# Patient Record
Sex: Male | Born: 2000 | Race: White | Hispanic: No | Marital: Single
Health system: Southern US, Community
[De-identification: ages and names within clinical notes are randomized; demographics above are authoritative.]

## PROBLEM LIST (undated history)

## (undated) DIAGNOSIS — R278 Other lack of coordination: Secondary | ICD-10-CM

## (undated) DIAGNOSIS — F902 Attention-deficit hyperactivity disorder, combined type: Principal | ICD-10-CM

## (undated) DIAGNOSIS — G43909 Migraine, unspecified, not intractable, without status migrainosus: Secondary | ICD-10-CM

## (undated) HISTORY — DX: Attention-deficit hyperactivity disorder, combined type: F90.2

## (undated) HISTORY — DX: Other lack of coordination: R27.8

---
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
## 2022-02-04 ENCOUNTER — Emergency Department
Admission: EM | Admit: 2022-02-04 | Discharge: 2022-02-04 | Disposition: A | Discharge status: Home / Self Care | Payer: Medicaid Other | Attending: Emergency Medicine | Admitting: Emergency Medicine

## 2022-02-04 ENCOUNTER — Encounter: Payer: Self-pay | Admitting: Emergency Medicine

## 2022-02-04 ENCOUNTER — Other Ambulatory Visit: Payer: Self-pay

## 2022-02-04 DIAGNOSIS — S8001XA Contusion of right knee, initial encounter: Secondary | ICD-10-CM | POA: Insufficient documentation

## 2022-02-04 DIAGNOSIS — Y92410 Unspecified street and highway as the place of occurrence of the external cause: Secondary | ICD-10-CM | POA: Diagnosis not present

## 2022-02-04 DIAGNOSIS — S8000XA Contusion of unspecified knee, initial encounter: Secondary | ICD-10-CM

## 2022-02-04 DIAGNOSIS — S3992XA Unspecified injury of lower back, initial encounter: Secondary | ICD-10-CM | POA: Diagnosis present

## 2022-02-04 DIAGNOSIS — S39012A Strain of muscle, fascia and tendon of lower back, initial encounter: Secondary | ICD-10-CM | POA: Diagnosis not present
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
## 2022-02-04 NOTE — ED Notes (Signed)
Dr. Corky Downs seeing pt in triage at this time.

## 2022-02-20 IMAGING — CR DG CHEST 2V
2 series · 2 of 2 positions shown · non-contrast
Comparison: May 11, 2020

CLINICAL DATA: Cough with hemoptysis

EXAM:
CHEST - 2 VIEW

[w chest pa]
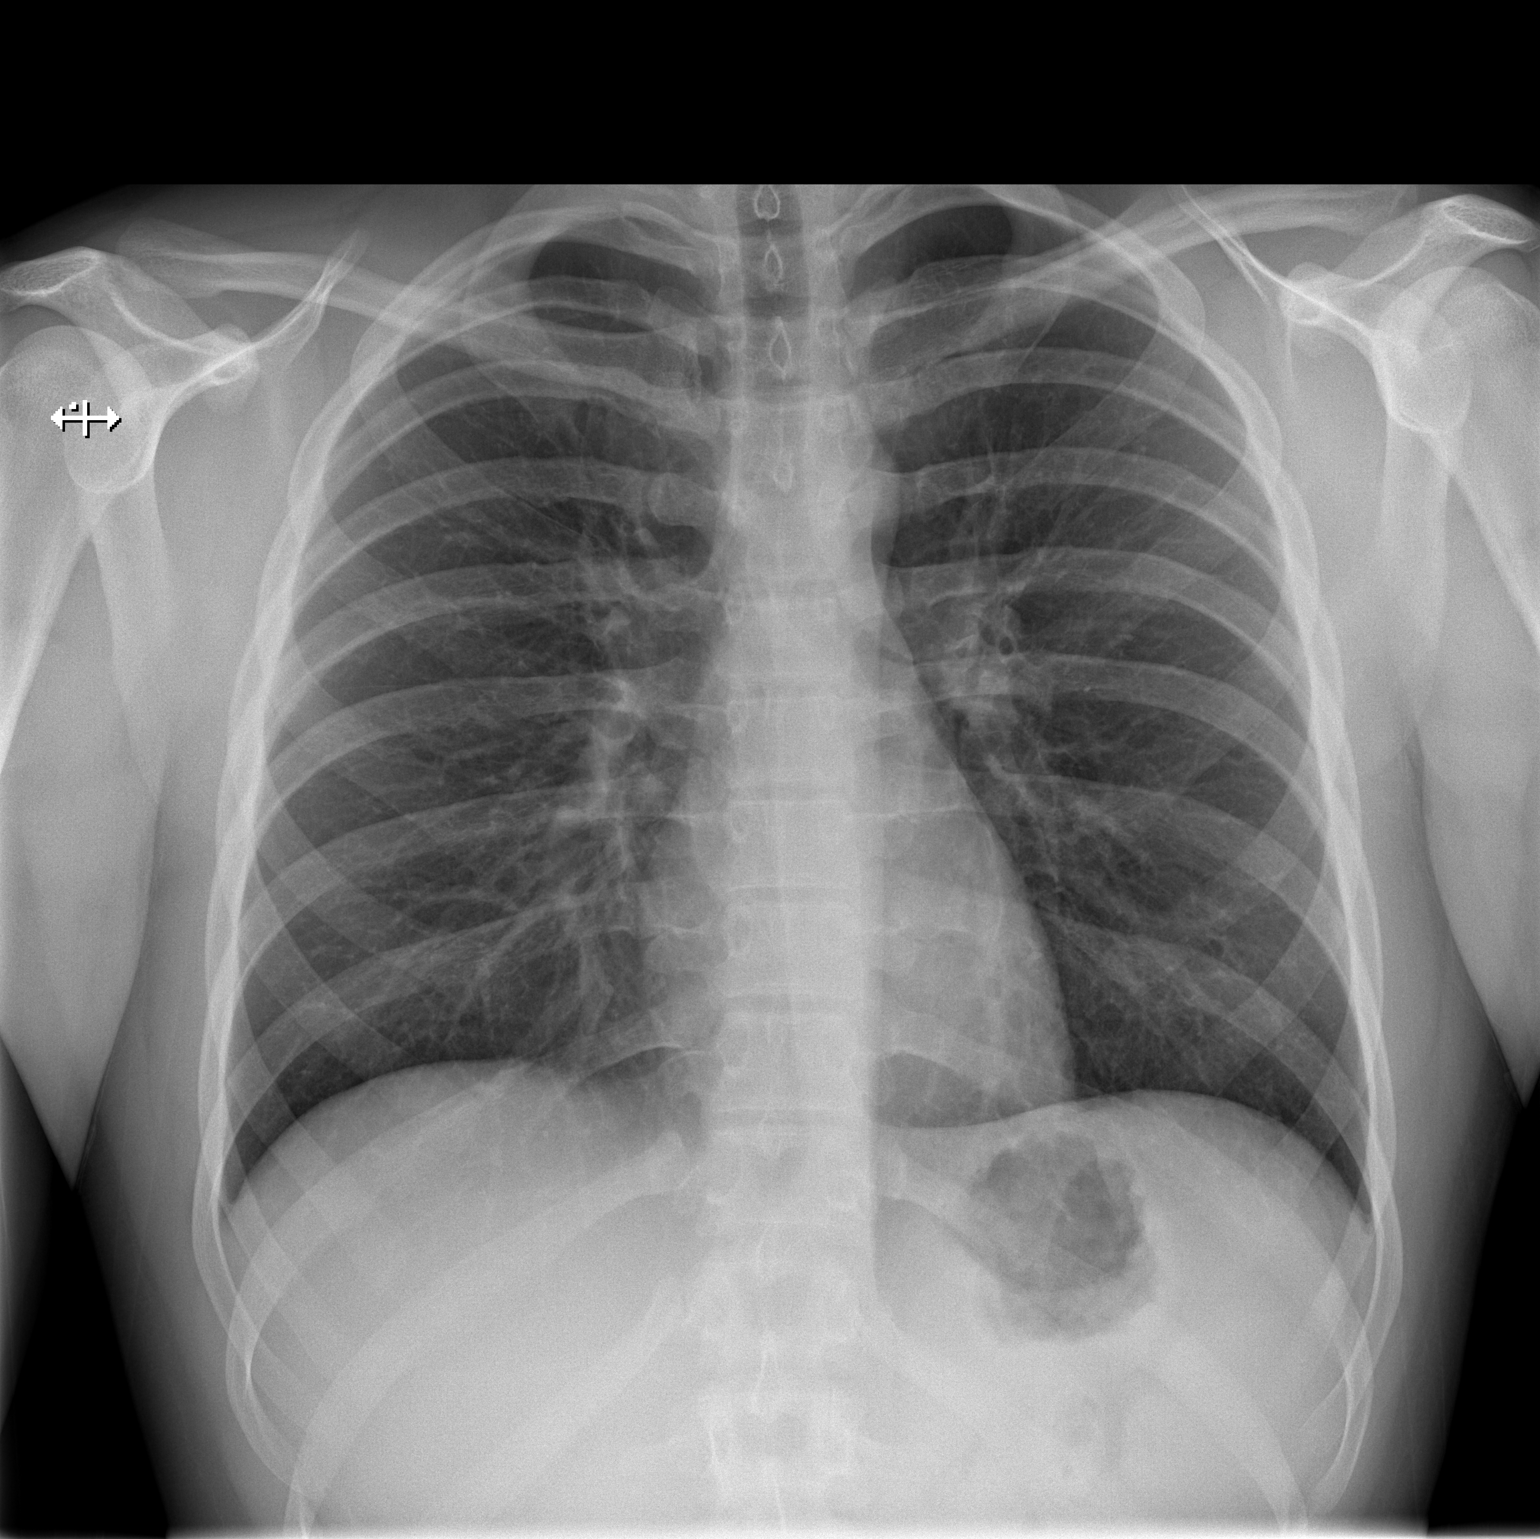

[w chest lat]
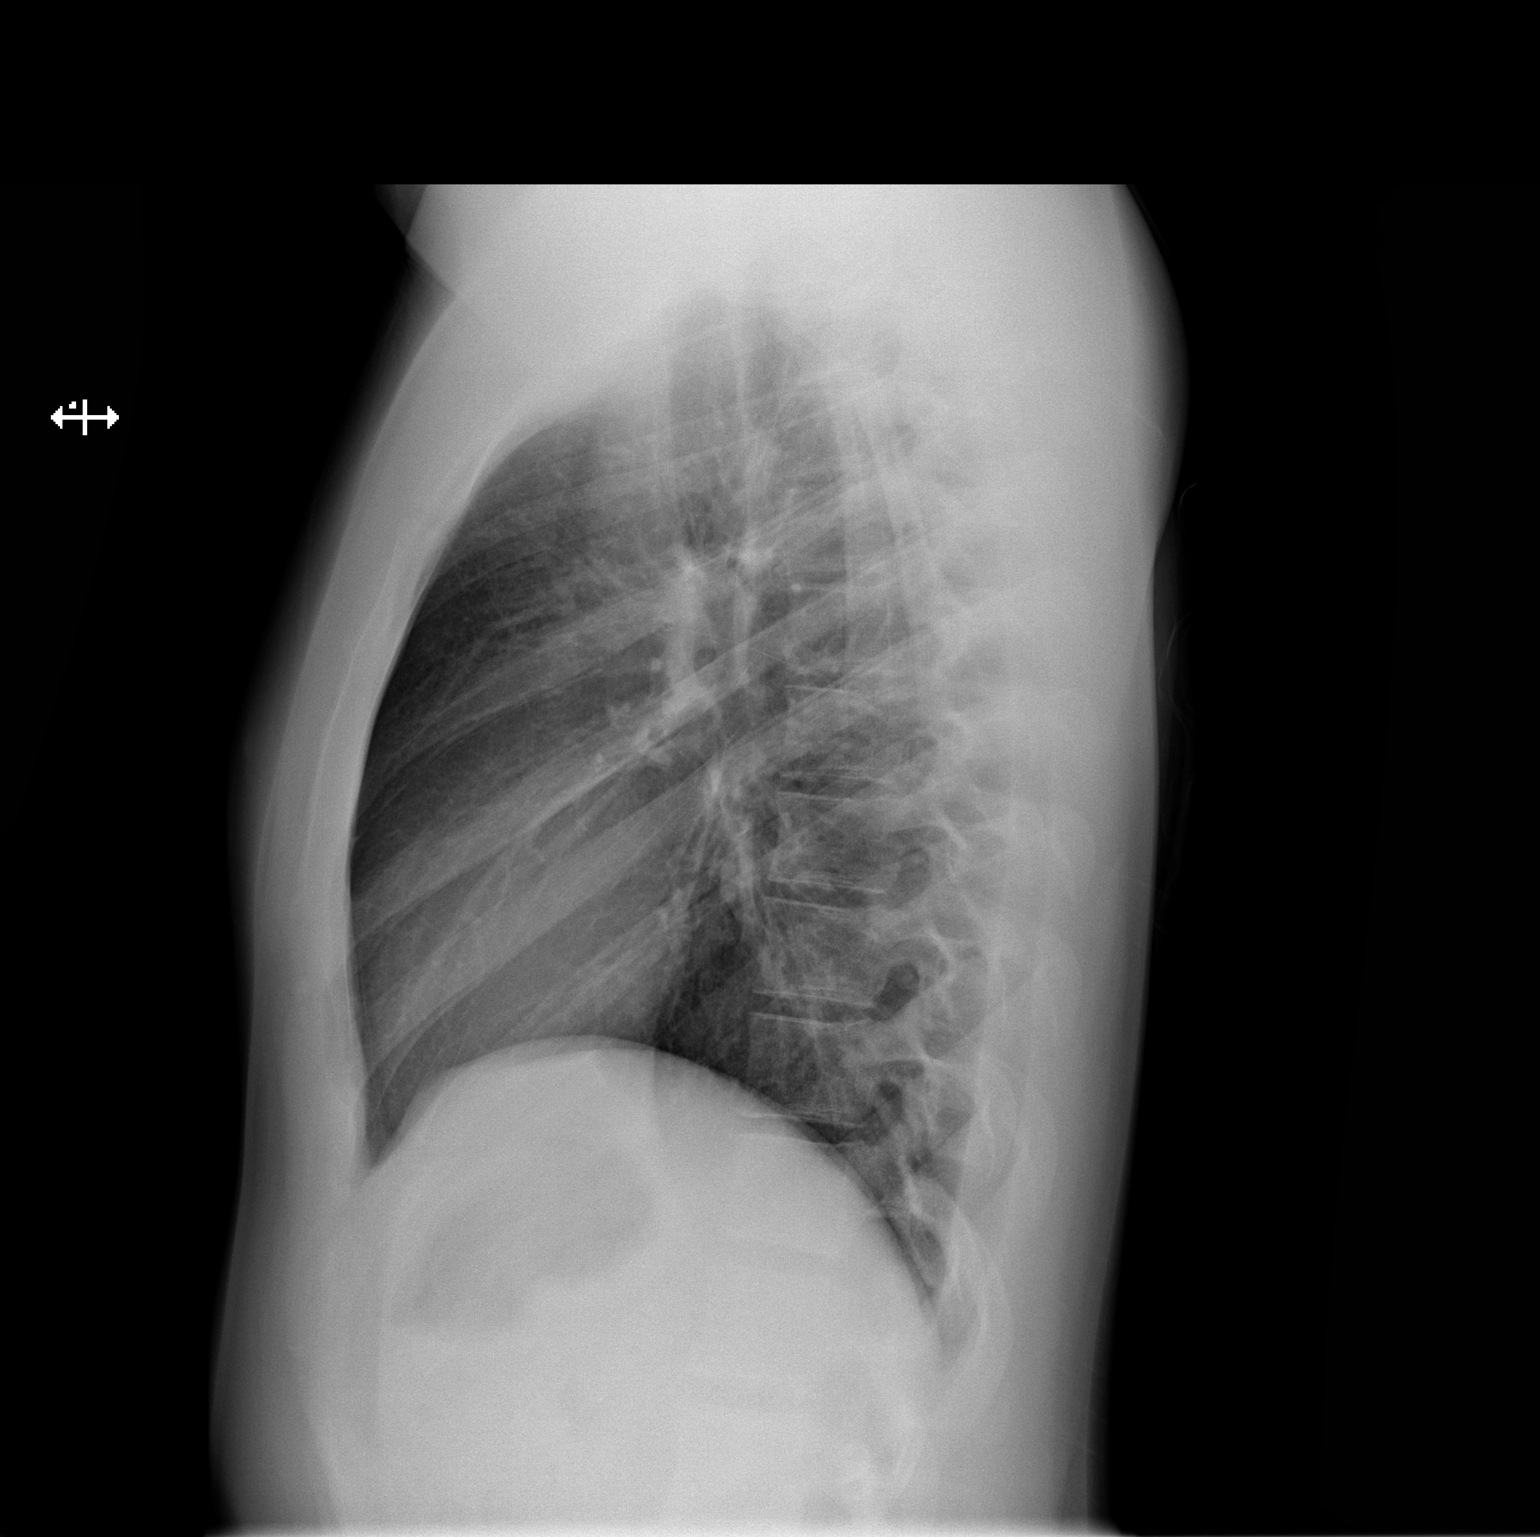

[2 of 2 positions shown; findings below may reference images not displayed]

FINDINGS: The lungs are clear. Heart size and pulmonary vascularity are
normal. No adenopathy. No pneumothorax. No bone lesions.
IMPRESSION: Lungs clear.  Cardiac silhouette normal.

## 2022-10-10 ENCOUNTER — Emergency Department (HOSPITAL_COMMUNITY): Payer: Medicaid Other

## 2022-10-10 ENCOUNTER — Emergency Department (HOSPITAL_COMMUNITY)
Admission: EM | Admit: 2022-10-10 | Discharge: 2022-10-11 | Disposition: A | Discharge status: Home / Self Care | Payer: Self-pay | Attending: Emergency Medicine | Admitting: Emergency Medicine

## 2022-10-10 ENCOUNTER — Encounter (HOSPITAL_COMMUNITY): Payer: Self-pay

## 2022-10-10 ENCOUNTER — Other Ambulatory Visit: Payer: Self-pay

## 2022-10-10 DIAGNOSIS — T6701XA Heatstroke and sunstroke, initial encounter: Secondary | ICD-10-CM | POA: Insufficient documentation

## 2022-10-10 DIAGNOSIS — Z1152 Encounter for screening for COVID-19: Secondary | ICD-10-CM | POA: Insufficient documentation

## 2022-10-10 LAB — CBC WITH DIFFERENTIAL/PLATELET
Abs Immature Granulocytes: 0.01 10*3/uL (ref 0.00–0.07)
Basophils Absolute: 0 10*3/uL (ref 0.0–0.1)
Basophils Relative: 1 %
Eosinophils Absolute: 0 10*3/uL (ref 0.0–0.5)
Eosinophils Relative: 0 %
HCT: 42.5 % (ref 39.0–52.0)
Hemoglobin: 14.8 g/dL (ref 13.0–17.0)
Immature Granulocytes: 0 %
Lymphocytes Relative: 12 %
Lymphs Abs: 0.5 10*3/uL — ABNORMAL LOW (ref 0.7–4.0)
MCH: 30.3 pg (ref 26.0–34.0)
MCHC: 34.8 g/dL (ref 30.0–36.0)
MCV: 87.1 fL (ref 80.0–100.0)
Monocytes Absolute: 0.6 10*3/uL (ref 0.1–1.0)
Monocytes Relative: 15 %
Neutro Abs: 2.8 10*3/uL (ref 1.7–7.7)
Neutrophils Relative %: 72 %
Platelets: 139 10*3/uL — ABNORMAL LOW (ref 150–400)
RBC: 4.88 MIL/uL (ref 4.22–5.81)
RDW: 11.5 % (ref 11.5–15.5)
WBC: 3.9 10*3/uL — ABNORMAL LOW (ref 4.0–10.5)
nRBC: 0 % (ref 0.0–0.2)

## 2022-10-10 LAB — COMPREHENSIVE METABOLIC PANEL
ALT: 20 U/L (ref 0–44)
AST: 20 U/L (ref 15–41)
Albumin: 4.3 g/dL (ref 3.5–5.0)
Alkaline Phosphatase: 52 U/L (ref 38–126)
Anion gap: 9 (ref 5–15)
BUN: 13 mg/dL (ref 6–20)
CO2: 23 mmol/L (ref 22–32)
Calcium: 8.6 mg/dL — ABNORMAL LOW (ref 8.9–10.3)
Chloride: 103 mmol/L (ref 98–111)
Creatinine, Ser: 1.07 mg/dL (ref 0.61–1.24)
GFR, Estimated: >60 mL/min (ref >60)
Glucose, Bld: 106 mg/dL — ABNORMAL HIGH (ref 70–99)
Potassium: 3.4 mmol/L — ABNORMAL LOW (ref 3.5–5.1)
Sodium: 135 mmol/L (ref 135–145)
Total Bilirubin: 0.7 mg/dL (ref 0.3–1.2)
Total Protein: 6.8 g/dL (ref 6.5–8.1)

## 2022-10-10 LAB — URINALYSIS, W/ REFLEX TO CULTURE (INFECTION SUSPECTED)
Bacteria, UA: NONE SEEN
Bilirubin Urine: NEGATIVE
Glucose, UA: NEGATIVE mg/dL
Hgb urine dipstick: NEGATIVE
Ketones, ur: NEGATIVE mg/dL
Leukocytes,Ua: NEGATIVE
Nitrite: NEGATIVE
Protein, ur: NEGATIVE mg/dL
Specific Gravity, Urine: 1.009 (ref 1.005–1.030)
pH: 7 (ref 5.0–8.0)

## 2022-10-10 LAB — LACTIC ACID, PLASMA: Lactic Acid, Venous: 1.5 mmol/L (ref 0.5–1.9)

## 2022-10-10 LAB — SARS CORONAVIRUS 2 BY RT PCR: SARS Coronavirus 2 by RT PCR: NEGATIVE

## 2022-10-10 LAB — PROTIME-INR
INR: 1.1 (ref 0.8–1.2)
Prothrombin Time: 14.5 seconds (ref 11.4–15.2)

## 2022-10-10 LAB — CBG MONITORING, ED: Glucose-Capillary: 103 mg/dL — ABNORMAL HIGH (ref 70–99)

## 2022-10-10 MED ORDER — SODIUM CHLORIDE 0.9 % IV BOLUS
2000.0000 mL | Freq: Once | INTRAVENOUS | Status: AC
  Administered 2022-10-10: 2000 mL via INTRAVENOUS

## 2022-10-10 MED ORDER — ACETAMINOPHEN 500 MG PO TABS
1000.0000 mg | ORAL_TABLET | Freq: Once | ORAL | Status: AC
  Administered 2022-10-10: 1000 mg via ORAL
  Filled 2022-10-10: qty 2
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
## 2022-10-10 NOTE — ED Provider Notes (Signed)
[place] EMERGENCY DEPARTMENT AT [facility] Provider Note   CSN: [id number] Arrival date & time: 10/10/22  2125     History {Add pertinent medical, surgical, social history, OB history to HPI:1} Chief Complaint  Patient presents with   Dizziness   Nausea    Pt bib ems from home for dizziness and nausea. Starting today, pt was in the sun for hours yesterday. Pt took ibuprofen around 8pm, 20 G LAC BG 86    Johnpaul Gillentine is a 22 y.o. male.  The history is provided by the patient, a parent and medical records. No language interpreter was used.  Dizziness    22 year old male significant history of ADHD, brought here via EMS from home with complaints of dizziness.  History obtained through mother and patient.  Patient report he was outside on a boat for approximately 10 hours throughout the day yesterday.  He went home around 10 PM, got up to go to work but had to leave home because he felt dizzy.  Described sensation of headache, lightheadedness, dizziness, feeling hot, feeling nauseous and fatigued.  Mom reports patient was quite drowsy which concerned her prompting this ER visit.  Patient otherwise denies any runny nose sneezing or coughing and no shortness of breath no abdominal discomfort no urinary symptoms denies any recent sick contact.  Home Medications Prior to Admission medications   Medication Sig Start Date End Date Taking? Authorizing Provider  dextromethorphan-guaiFENesin (MUCINEX DM) 30-600 MG 12hr tablet Take 1 tablet by mouth 2 (two) times daily. 09/02/20   Dartha Lodge, PA-C  ondansetron (ZOFRAN) 4 MG tablet Take 1 tablet at onset of migraine associated with nausea 06/24/20   Deetta Perla, MD  rizatriptan (MAXALT) 10 MG tablet Take 1 tablet at onset of migraine with 2 ibuprofen, may repeat in 2 hours if needed 06/24/20   Deetta Perla, MD      Allergies    Patient has no known allergies.    Review of Systems   Review of Systems   Neurological:  Positive for dizziness.  All other systems reviewed and are negative.   Physical Exam Updated Vital Signs BP 132/81   Pulse 98   Temp (!) 102.9 F (39.4 C) (Oral) Comment: RN was notified of elevated temp  Resp (!) 28   Ht 6\' 1"  (1.854 m)   Wt 86.2 kg   SpO2 98%   BMI 25.07 kg/m  Physical Exam Vitals and nursing note reviewed.  Constitutional:      General: He is not in acute distress.    Appearance: He is well-developed.     Comments: Drowsy but arousable and answer question.  HENT:     Head: Normocephalic and atraumatic.     Nose: Nose normal.     Mouth/Throat:     Mouth: Mucous membranes are moist.  Eyes:     Extraocular Movements: Extraocular movements intact.     Conjunctiva/sclera: Conjunctivae normal.     Pupils: Pupils are equal, round, and reactive to light.  Cardiovascular:     Rate and Rhythm: Tachycardia present.     Pulses: Normal pulses.     Heart sounds: Normal heart sounds.  Pulmonary:     Effort: Pulmonary effort is normal.     Breath sounds: Normal breath sounds. No wheezing, rhonchi or rales.  Abdominal:     Palpations: Abdomen is soft.     Tenderness: There is no abdominal tenderness.  Musculoskeletal:  General: Normal range of motion.     Cervical back: Normal range of motion and neck supple. No rigidity.     Comments: Able to move all 4 extremities with equal effort  Skin:    General: Skin is warm.     Findings: No rash.  Neurological:     General: No focal deficit present.     Comments: Alert oriented x 2, able to recall year and current event.  Thought it was January instead of June.     ED Results / Procedures / Treatments   Labs (all labs ordered are listed, but only abnormal results are displayed) Labs Reviewed  CBG MONITORING, ED - Abnormal; Notable for the following components:      Result Value   Glucose-Capillary 103 (*)    All other components within normal limits  CULTURE, BLOOD (ROUTINE X 2)   CULTURE, BLOOD (ROUTINE X 2)  COMPREHENSIVE METABOLIC PANEL  LACTIC ACID, PLASMA  LACTIC ACID, PLASMA  CBC WITH DIFFERENTIAL/PLATELET  PROTIME-INR  URINALYSIS, W/ REFLEX TO CULTURE (INFECTION SUSPECTED)    EKG None  Radiology No results found.  Procedures Procedures  {Document cardiac monitor, telemetry assessment procedure when appropriate:1}  Medications Ordered in ED Medications - No data to display  ED Course/ Medical Decision Making/ A&P   {   Click here for ABCD2, HEART and other calculatorsREFRESH Note before signing :1}                          Medical Decision Making  BP (!) 145/84   Pulse 100   Temp (!) 102.9 F (39.4 C) (Oral) Comment: RN was notified of elevated temp  Resp 20   Ht 6\' 1"  (1.854 m)   Wt 86.2 kg   SpO2 97%   BMI 25.07 kg/m   76:61 PM  23 year old male significant history of ADHD, brought here via EMS from home with complaints of dizziness.  History obtained through mother and patient.  Patient report he was outside on a boat for approximately 10 hours throughout the day yesterday.  He went home around 10 PM, got up to go to work but had to leave home because he felt dizzy.  Described sensation of headache, lightheadedness, dizziness, feeling hot, feeling nauseous and fatigued.  Mom reports patient was quite drowsy which concerned her prompting this ER visit.  Patient otherwise denies any runny nose sneezing or coughing and no shortness of breath no abdominal discomfort no urinary symptoms denies any recent sick contact.  On exam, patient appears flushed, is a bit drowsy but arousable and able to answer questions albeit slow.  He has equal strength throughout with poor effort.  Skin is very warm to the touch.  Heart with mild tachycardia, lungs are clear to auscultation bilaterally abdomen is soft nontender  Vital signs notable for an oral temperature of 102.9.  Heart rate of 100, O2 sats of 97%.  Presentation concerning for heatstroke versus  infectious etiology however patient without any infectious symptoms.  Request nurse to apply ice pack to armpits, give IV fluid, give Tylenol and will monitor patient closely.  -Labs ordered, independently viewed and interpreted by me.  Labs remarkable for *** -The patient was maintained on a cardiac monitor.  I personally viewed and interpreted the cardiac monitored which showed an underlying rhythm of: *** -Imaging independently viewed and interpreted by me and I agree with radiologist's interpretation.  Result remarkable for *** -This patient presents to the  ED for concern of ***, this involves an extensive number of treatment options, and is a complaint that carries with it a high risk of complications and morbidity.  The differential diagnosis includes *** -Co morbidities that complicate the patient evaluation includes *** -Treatment includes *** -Reevaluation of the patient after these medicines showed that the patient {resolved/improved/worsened:23923::"improved"} -PCP office notes or outside notes reviewed -Discussion with specialist *** -Escalation to admission/observation considered: patients feels much better, is comfortable with discharge, and will follow up with PCP -Prescription medication considered, patient comfortable with *** -Social Determinant of Health considered which includes ***   {Document critical care time when appropriate:1} {Document review of labs and clinical decision tools ie heart score, Chads2Vasc2 etc:1}  {Document your independent review of radiology images, and any outside records:1} {Document your discussion with family members, caretakers, and with consultants:1} {Document social determinants of health affecting pt's care:1} {Document your decision making why or why not admission, treatments were needed:1} Final Clinical Impression(s) / ED Diagnoses Final diagnoses:  None    Rx / DC Orders ED Discharge Orders     None

## 2022-10-11 ENCOUNTER — Encounter (HOSPITAL_COMMUNITY): Payer: Self-pay | Admitting: Emergency Medicine

## 2022-10-11 LAB — CK: Total CK: 73 U/L (ref 49–397)

## 2022-10-11 MED ORDER — SODIUM CHLORIDE 0.9 % IV BOLUS
1000.0000 mL | Freq: Once | INTRAVENOUS | Status: AC
  Administered 2022-10-11: 1000 mL via INTRAVENOUS

## 2022-10-16 LAB — CULTURE, BLOOD (ROUTINE X 2)
Culture: NO GROWTH
Culture: NO GROWTH
Special Requests: ADEQUATE
